# Patient Record
Sex: Female | Born: 1955 | Race: White | Hispanic: No | Smoking: Current every day smoker
Health system: Southern US, Community
[De-identification: ages and names within clinical notes are randomized; demographics above are authoritative.]

## PROBLEM LIST (undated history)

## (undated) DIAGNOSIS — I10 Essential (primary) hypertension: Secondary | ICD-10-CM

## (undated) DIAGNOSIS — T7840XA Allergy, unspecified, initial encounter: Secondary | ICD-10-CM

## (undated) DIAGNOSIS — E785 Hyperlipidemia, unspecified: Secondary | ICD-10-CM

## (undated) HISTORY — DX: Essential (primary) hypertension: I10

## (undated) HISTORY — PX: BACK SURGERY: SHX140

## (undated) HISTORY — DX: Hyperlipidemia, unspecified: E78.5

## (undated) HISTORY — DX: Allergy, unspecified, initial encounter: T78.40XA

## (undated) HISTORY — PX: ABDOMINAL HYSTERECTOMY: SHX81

## (undated) HISTORY — PX: THORACOTOMY: SUR1349

---

## 2004-08-21 ENCOUNTER — Emergency Department (HOSPITAL_COMMUNITY): Admission: EM | Admit: 2004-08-21 | Discharge: 2004-08-21 | Payer: Self-pay | Admitting: Emergency Medicine

## 2005-01-09 ENCOUNTER — Encounter: Admission: RE | Admit: 2005-01-09 | Discharge: 2005-01-09 | Payer: Self-pay | Admitting: Family Medicine

## 2007-03-05 IMAGING — CT CT CHEST
2 of 4 series · 16 of 31 positions shown, 18 images · IV contrast (CONTRAST)
Comparison: none

______________________________________________________________
RUSK, ASUCENA 12/11/1917 CENA, CHRISTALENA [DATE]

REASON FOR CONSULTATION: Last Menstrual Date:..... POST Reason
for Consultation:. SHORTNESS OF BREATH Comment to Radiology:.... RO
INFILTRATE MS5
____________________________________________
EXAM: CHEST ROUTINE
Comparison is made to study 01 April, 1996.
The LEFT lung is clear. On the RIGHT there is some volume loss with
atelectasis noted. A small amount of pleural fluid is present though
this fluid is stable. The heart is not enlarged and I see no pulmonary
vascular congestion.

[Series 2: with · axial · 0.68mm/px · z∈[+1293,+1523]mm · 8 of 62 slices shown, 10 images]
[im 8/62  mediastinal]
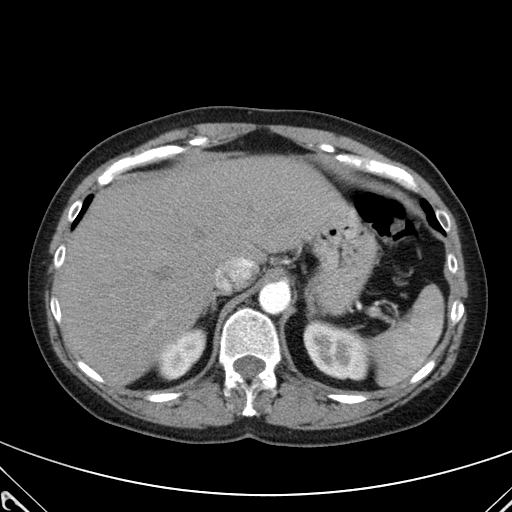
[im 8/62  lung]
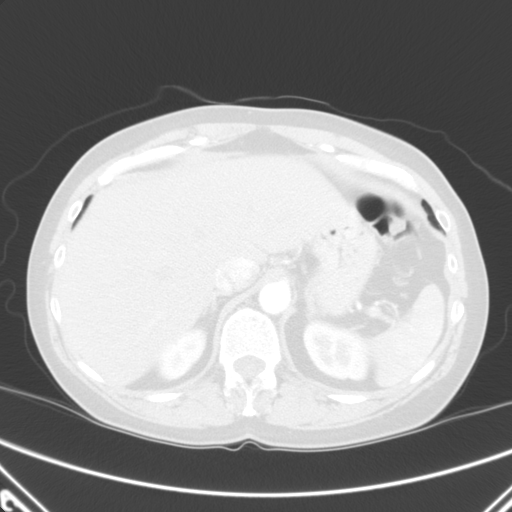
[im 16/62  lung]
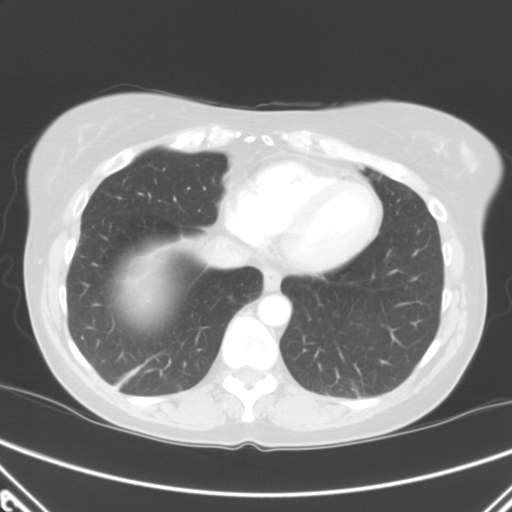
[im 23/62  lung]
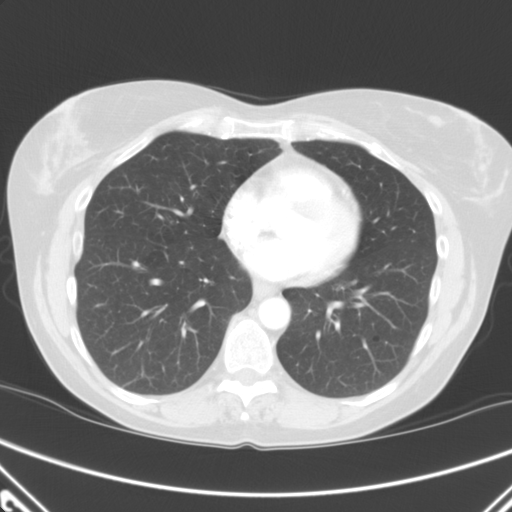
[im 30/62  lung]
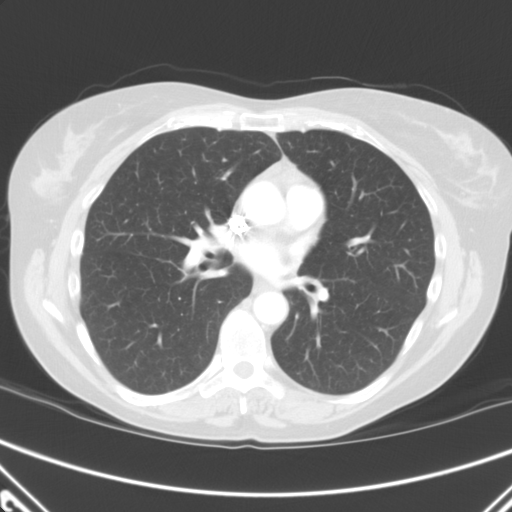
[im 31/62  mediastinal]
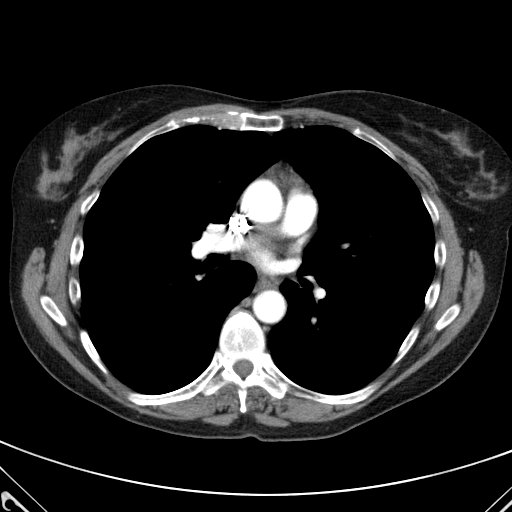
[im 31/62  lung]
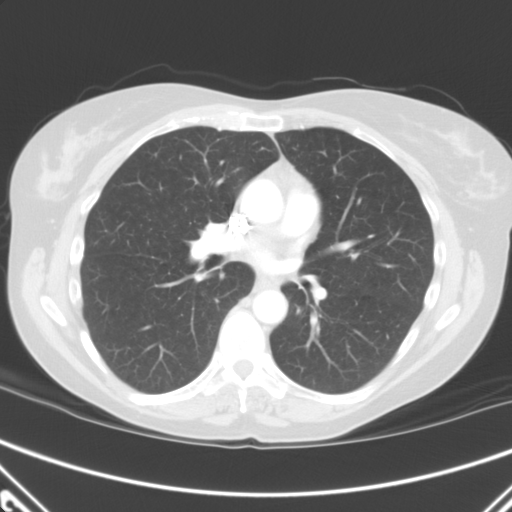
[im 39/62  lung]
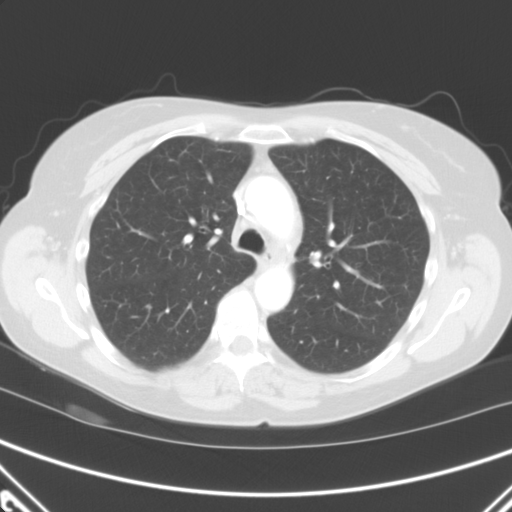
[im 46/62  lung]
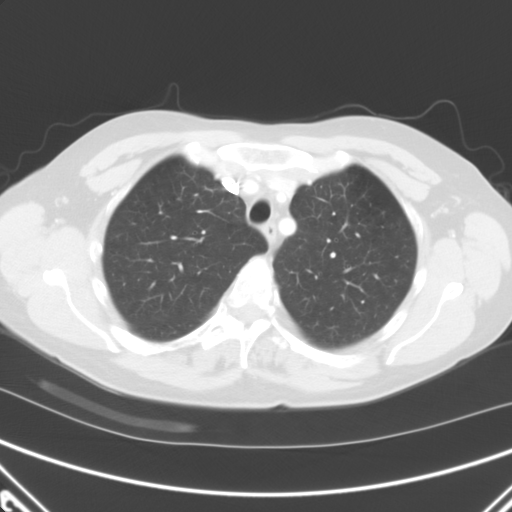
[im 54/62  lung]
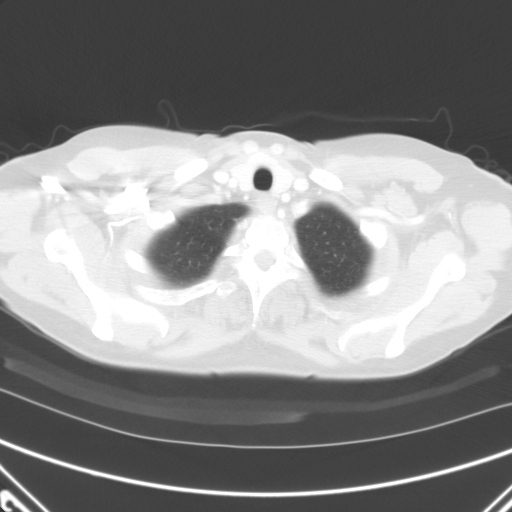

[Series 5: bone · axial · 0.68mm/px · z∈[+1293,+1523]mm · 8 of 62 slices shown]
[im 8/62  lung]
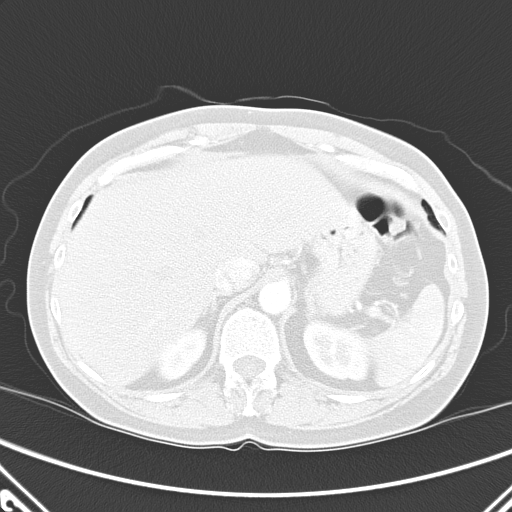
[im 16/62  lung]
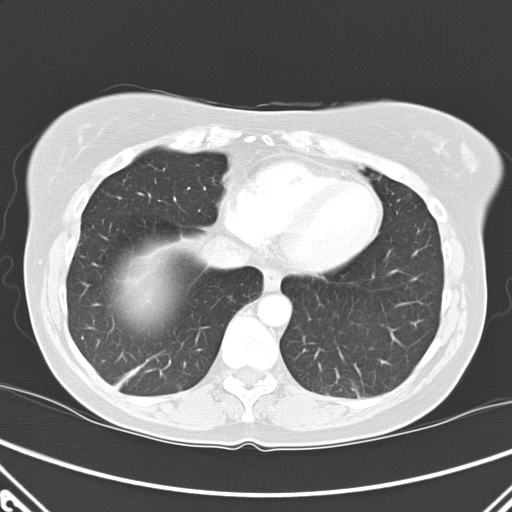
[im 23/62  lung]
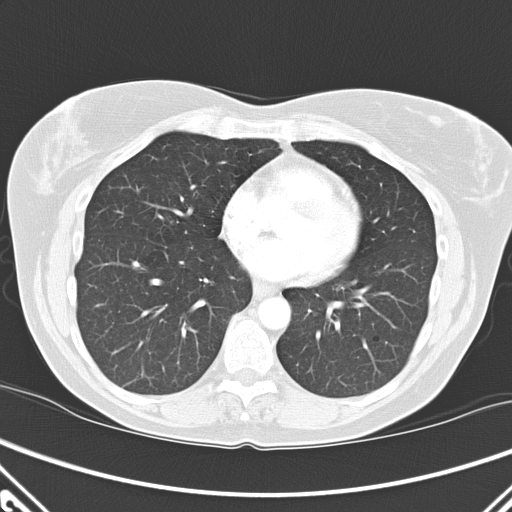
[im 30/62  lung]
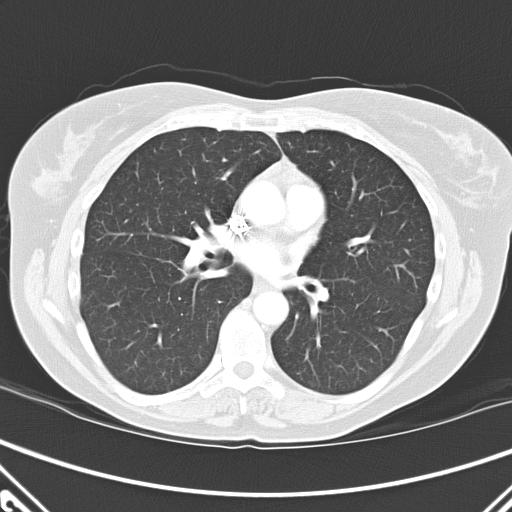
[im 31/62  lung]
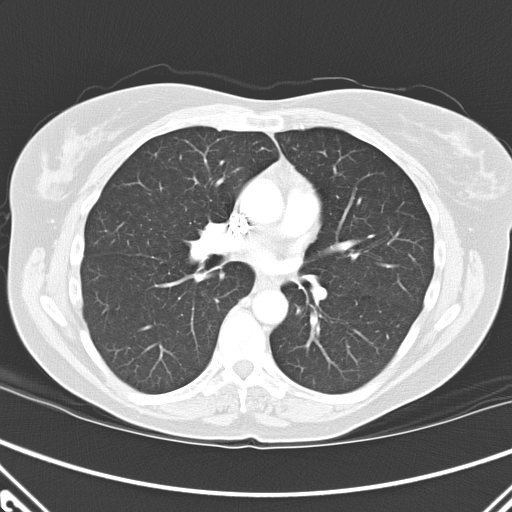
[im 39/62  lung]
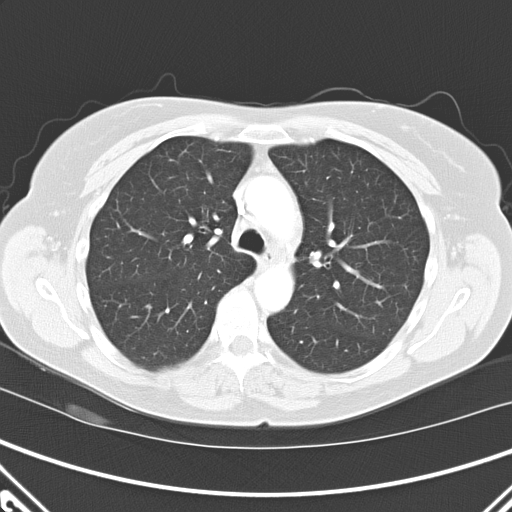
[im 46/62  lung]
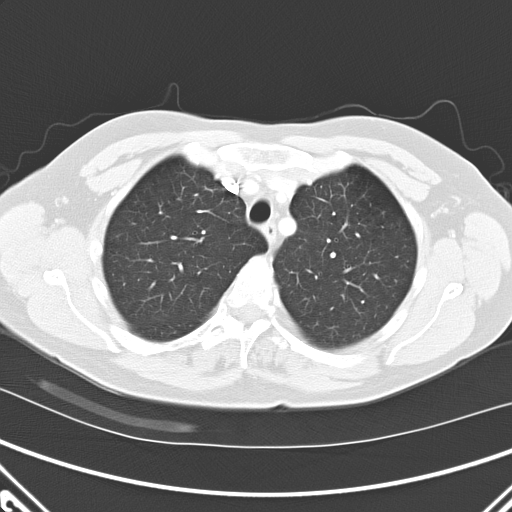
[im 54/62  lung]
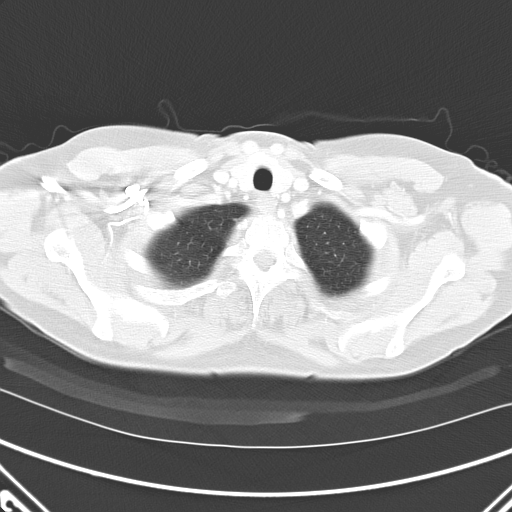

[16 of 31 positions shown; findings below may reference images not displayed]

IMPRESSION: 1. Atelectasis on the RIGHT. Small pleural effusion. Overall there
 has not been dramatic change since 01 April, 1996.

## 2017-05-26 DIAGNOSIS — L821 Other seborrheic keratosis: Secondary | ICD-10-CM | POA: Diagnosis not present

## 2017-05-26 DIAGNOSIS — L820 Inflamed seborrheic keratosis: Secondary | ICD-10-CM | POA: Diagnosis not present

## 2018-08-02 DIAGNOSIS — S61211A Laceration without foreign body of left index finger without damage to nail, initial encounter: Secondary | ICD-10-CM | POA: Diagnosis not present

## 2018-08-02 DIAGNOSIS — S51812A Laceration without foreign body of left forearm, initial encounter: Secondary | ICD-10-CM | POA: Diagnosis not present

## 2018-08-12 DIAGNOSIS — M7700 Medial epicondylitis, unspecified elbow: Secondary | ICD-10-CM | POA: Diagnosis not present

## 2018-08-12 DIAGNOSIS — Z09 Encounter for follow-up examination after completed treatment for conditions other than malignant neoplasm: Secondary | ICD-10-CM | POA: Diagnosis not present

## 2018-08-12 DIAGNOSIS — F17200 Nicotine dependence, unspecified, uncomplicated: Secondary | ICD-10-CM | POA: Diagnosis not present

## 2018-08-12 DIAGNOSIS — Z4889 Encounter for other specified surgical aftercare: Secondary | ICD-10-CM | POA: Diagnosis not present

## 2018-11-26 DIAGNOSIS — Z0000 Encounter for general adult medical examination without abnormal findings: Secondary | ICD-10-CM | POA: Diagnosis not present

## 2018-11-26 DIAGNOSIS — M810 Age-related osteoporosis without current pathological fracture: Secondary | ICD-10-CM | POA: Diagnosis not present

## 2018-11-26 DIAGNOSIS — J449 Chronic obstructive pulmonary disease, unspecified: Secondary | ICD-10-CM | POA: Diagnosis not present

## 2018-11-26 DIAGNOSIS — E785 Hyperlipidemia, unspecified: Secondary | ICD-10-CM | POA: Diagnosis not present

## 2018-11-30 ENCOUNTER — Other Ambulatory Visit: Payer: Self-pay | Admitting: Family Medicine

## 2018-11-30 DIAGNOSIS — I739 Peripheral vascular disease, unspecified: Secondary | ICD-10-CM

## 2018-12-10 ENCOUNTER — Other Ambulatory Visit: Payer: Self-pay

## 2018-12-14 ENCOUNTER — Ambulatory Visit
Admission: RE | Admit: 2018-12-14 | Discharge: 2018-12-14 | Disposition: A | Discharge status: Home / Self Care | Payer: BLUE CROSS/BLUE SHIELD | Source: Ambulatory Visit | Attending: Family Medicine | Admitting: Family Medicine

## 2018-12-14 DIAGNOSIS — I739 Peripheral vascular disease, unspecified: Secondary | ICD-10-CM

## 2019-03-25 DIAGNOSIS — I10 Essential (primary) hypertension: Secondary | ICD-10-CM | POA: Diagnosis not present

## 2019-03-25 DIAGNOSIS — E559 Vitamin D deficiency, unspecified: Secondary | ICD-10-CM | POA: Diagnosis not present

## 2019-12-02 DIAGNOSIS — I10 Essential (primary) hypertension: Secondary | ICD-10-CM | POA: Diagnosis not present

## 2019-12-02 DIAGNOSIS — M2550 Pain in unspecified joint: Secondary | ICD-10-CM | POA: Diagnosis not present

## 2019-12-02 DIAGNOSIS — E559 Vitamin D deficiency, unspecified: Secondary | ICD-10-CM | POA: Diagnosis not present

## 2019-12-02 DIAGNOSIS — E785 Hyperlipidemia, unspecified: Secondary | ICD-10-CM | POA: Diagnosis not present

## 2019-12-02 DIAGNOSIS — Z0000 Encounter for general adult medical examination without abnormal findings: Secondary | ICD-10-CM | POA: Diagnosis not present

## 2019-12-02 DIAGNOSIS — J4530 Mild persistent asthma, uncomplicated: Secondary | ICD-10-CM | POA: Diagnosis not present

## 2019-12-02 DIAGNOSIS — J449 Chronic obstructive pulmonary disease, unspecified: Secondary | ICD-10-CM | POA: Diagnosis not present

## 2020-05-12 DIAGNOSIS — R002 Palpitations: Secondary | ICD-10-CM | POA: Diagnosis not present

## 2020-12-07 DIAGNOSIS — E785 Hyperlipidemia, unspecified: Secondary | ICD-10-CM | POA: Diagnosis not present

## 2020-12-07 DIAGNOSIS — E559 Vitamin D deficiency, unspecified: Secondary | ICD-10-CM | POA: Diagnosis not present

## 2020-12-07 DIAGNOSIS — M810 Age-related osteoporosis without current pathological fracture: Secondary | ICD-10-CM | POA: Diagnosis not present

## 2020-12-07 DIAGNOSIS — Z0000 Encounter for general adult medical examination without abnormal findings: Secondary | ICD-10-CM | POA: Diagnosis not present

## 2020-12-07 DIAGNOSIS — Z23 Encounter for immunization: Secondary | ICD-10-CM | POA: Diagnosis not present

## 2020-12-07 DIAGNOSIS — J449 Chronic obstructive pulmonary disease, unspecified: Secondary | ICD-10-CM | POA: Diagnosis not present

## 2020-12-07 DIAGNOSIS — R69 Illness, unspecified: Secondary | ICD-10-CM | POA: Diagnosis not present

## 2020-12-07 DIAGNOSIS — I10 Essential (primary) hypertension: Secondary | ICD-10-CM | POA: Diagnosis not present

## 2021-05-30 DIAGNOSIS — R002 Palpitations: Secondary | ICD-10-CM | POA: Diagnosis not present

## 2021-12-20 DIAGNOSIS — E559 Vitamin D deficiency, unspecified: Secondary | ICD-10-CM | POA: Diagnosis not present

## 2021-12-20 DIAGNOSIS — M810 Age-related osteoporosis without current pathological fracture: Secondary | ICD-10-CM | POA: Diagnosis not present

## 2021-12-20 DIAGNOSIS — Z23 Encounter for immunization: Secondary | ICD-10-CM | POA: Diagnosis not present

## 2021-12-20 DIAGNOSIS — I739 Peripheral vascular disease, unspecified: Secondary | ICD-10-CM | POA: Diagnosis not present

## 2021-12-20 DIAGNOSIS — J4530 Mild persistent asthma, uncomplicated: Secondary | ICD-10-CM | POA: Diagnosis not present

## 2021-12-20 DIAGNOSIS — J449 Chronic obstructive pulmonary disease, unspecified: Secondary | ICD-10-CM | POA: Diagnosis not present

## 2021-12-20 DIAGNOSIS — I10 Essential (primary) hypertension: Secondary | ICD-10-CM | POA: Diagnosis not present

## 2021-12-20 DIAGNOSIS — M7071 Other bursitis of hip, right hip: Secondary | ICD-10-CM | POA: Diagnosis not present

## 2021-12-20 DIAGNOSIS — Z0000 Encounter for general adult medical examination without abnormal findings: Secondary | ICD-10-CM | POA: Diagnosis not present

## 2021-12-20 DIAGNOSIS — E785 Hyperlipidemia, unspecified: Secondary | ICD-10-CM | POA: Diagnosis not present

## 2022-01-02 ENCOUNTER — Other Ambulatory Visit: Payer: Self-pay

## 2022-01-02 DIAGNOSIS — I739 Peripheral vascular disease, unspecified: Secondary | ICD-10-CM

## 2022-01-17 ENCOUNTER — Ambulatory Visit (HOSPITAL_COMMUNITY)
Admission: RE | Admit: 2022-01-17 | Discharge: 2022-01-17 | Disposition: A | Discharge status: Home / Self Care | Payer: Medicare HMO | Source: Ambulatory Visit | Attending: Vascular Surgery | Admitting: Vascular Surgery

## 2022-01-17 ENCOUNTER — Encounter: Payer: Self-pay | Admitting: Vascular Surgery

## 2022-01-17 ENCOUNTER — Ambulatory Visit: Payer: Medicare HMO | Admitting: Vascular Surgery

## 2022-01-17 ENCOUNTER — Other Ambulatory Visit: Payer: Self-pay

## 2022-01-17 VITALS — BP 151/86 | HR 81 | Temp 98.0°F | Resp 20 | Ht 64.5 in | Wt 151.0 lb

## 2022-01-17 DIAGNOSIS — I739 Peripheral vascular disease, unspecified: Secondary | ICD-10-CM | POA: Insufficient documentation

## 2022-01-17 DIAGNOSIS — I70219 Atherosclerosis of native arteries of extremities with intermittent claudication, unspecified extremity: Secondary | ICD-10-CM

## 2022-01-17 NOTE — Progress Notes (Signed)
? ?ASSESSMENT & PLAN  ? ?PERIPHERAL ARTERIAL DISEASE: Based on her history and physical exam I suspect this patient has some aortoiliac occlusive disease which is more significant on the right side.  She has a slight diminished right femoral pulse.  We have discussed the importance of tobacco cessation (3 min).  I have also encouraged her to get on a structured walking program.  In addition we discussed the importance of nutrition.  I favor a largely plant-based diet. I have encouraged her to begin taking 81 mg of aspirin daily.  She is on a statin. ? ?If her symptoms progress or she develops rest pain or nonhealing ulcer then certainly we would consider arteriography.  I have ordered follow-up ABIs in 9 months and I will get an aortoiliac duplex at that time to further evaluate her iliac disease.  If her symptoms progress and she has limited iliac disease we could consider iliac angioplasty and stenting.  I will see her back in 9 months.  She knows to call sooner if she has problems. ? ?Recommend the following which can slow the progression of atherosclerosis and reduce the risk of major adverse cardiac / limb events:  ?Aspirin 81mg  PO QD.  ?Atorvastatin 40-80mg  PO QD (or other "high intensity" statin therapy). ?Complete cessation from all tobacco products. ?Blood glucose control with goal A1c < 7%. ?Blood pressure control with goal blood pressure < 140/90 mmHg. ?Lipid reduction therapy with goal LDL-C <100 mg/dL ( if symptomatic from PAD).  ? ? ?REASON FOR CONSULT:   ? ?Right leg pain.  The consult is requested by Dr. <76. ? ?HPI:  ? ?Kiara Gibbs is a 66 y.o. female who was referred for evaluation of peripheral arterial disease.  I have reviewed the records from the referring office.  The patient was seen by Dr. 71 on 12/20/2021.  The patient is followed with mild asthma, hypertension, vitamin D deficiency, dyslipidemia, bursitis of the right hip.  She was also complaining of some right leg  claudication symptoms and was sent for vascular consultation.  ? ?On my history the patient noted the gradual onset of right thigh claudication about 2 years ago.  The symptoms are brought on by ambulation and relieved with rest.  They occur when her dog is pulling on her and she is having to walk faster.  She subsequently developed some claudication symptoms in the left thigh also.  She denies any Claudication.  She denies any history of rest pain or nonhealing ulcers. ? ?Her risk factors for peripheral vascular disease include hypertension, hypercholesterolemia, and tobacco use.  She smokes a half a pack to 1 pack/day and has been smoking for 40 years.  She denies any history of diabetes.  She denies any family history of premature cardiovascular disease. ? ?Past Medical History:  ?Diagnosis Date  ? Allergy   ? Hyperlipidemia   ? Hypertension   ? ? ?History reviewed. No pertinent family history. ? ?SOCIAL HISTORY: ?Social History  ? ?Tobacco Use  ? Smoking status: Every Day  ?  Packs/day: 1.00  ?  Types: Cigarettes  ? Smokeless tobacco: Never  ?Substance Use Topics  ? Alcohol use: Yes  ? ? ?Allergies  ?Allergen Reactions  ? Bee Venom   ?  Other reaction(s): unknown  ? Bupropion Hcl Er (Sr)   ?  Other reaction(s): Unknown  ? Penicillins   ?  Other reaction(s): Unknown  ? ? ?Current Outpatient Medications  ?Medication Sig Dispense Refill  ? albuterol (  VENTOLIN HFA) 108 (90 Base) MCG/ACT inhaler 2 puffs as needed    ? losartan-hydrochlorothiazide (HYZAAR) 100-12.5 MG tablet Take 1 tablet by mouth daily.    ? metoprolol succinate (TOPROL-XL) 100 MG 24 hr tablet Take 100 mg by mouth daily.    ? rosuvastatin (CRESTOR) 20 MG tablet Take 20 mg by mouth daily.    ? SYMBICORT 160-4.5 MCG/ACT inhaler Inhale into the lungs.    ? ?No current facility-administered medications for this visit.  ? ? ?REVIEW OF SYSTEMS:  ?[X]  denotes positive finding, [ ]  denotes negative finding ?Cardiac  Comments:  ?Chest pain or chest pressure:     ?Shortness of breath upon exertion:    ?Short of breath when lying flat:    ?Irregular heart rhythm:    ?    ?Vascular    ?Pain in calf, thigh, or hip brought on by ambulation: x   ?Pain in feet at night that wakes you up from your sleep:     ?Blood clot in your veins:    ?Leg swelling:     ?    ?Pulmonary    ?Oxygen at home:    ?Productive cough:     ?Wheezing:     ?    ?Neurologic    ?Sudden weakness in arms or legs:     ?Sudden numbness in arms or legs:     ?Sudden onset of difficulty speaking or slurred speech:    ?Temporary loss of vision in one eye:     ?Problems with dizziness:     ?    ?Gastrointestinal    ?Blood in stool:     ?Vomited blood:     ?    ?Genitourinary    ?Burning when urinating:     ?Blood in urine:    ?    ?Psychiatric    ?Major depression:     ?    ?Hematologic    ?Bleeding problems:    ?Problems with blood clotting too easily:    ?    ?Skin    ?Rashes or ulcers:    ?    ?Constitutional    ?Fever or chills:    ?- ? ?PHYSICAL EXAM:  ? ?Vitals:  ? 01/17/22 0910  ?BP: (!) 151/86  ?Pulse: 81  ?Resp: 20  ?Temp: 98 ?F (36.7 ?C)  ?SpO2: 96%  ?Weight: 151 lb (68.5 kg)  ?Height: 5' 4.5" (1.638 m)  ? ?Body mass index is 25.52 kg/m?. ?GENERAL: The patient is a well-nourished female, in no acute distress. The vital signs are documented above. ?CARDIAC: There is a regular rate and rhythm.  ?VASCULAR: I do not detect carotid bruits. ?On the right side she has a slightly diminished femoral pulse.  I cannot palpate pedal pulses. ?On the left side she has a normal femoral pulse.  I cannot palpate pedal pulses. ?She has no significant lower extremity swelling. ?PULMONARY: There is good air exchange bilaterally without wheezing or rales. ?ABDOMEN: Soft and non-tender with normal pitched bowel sounds.  I do not palpate an aneurysm. ?MUSCULOSKELETAL: There are no major deformities. ?NEUROLOGIC: No focal weakness or paresthesias are detected. ?SKIN: There are no ulcers or rashes noted. ?PSYCHIATRIC: The patient  has a normal affect. ? ?DATA:   ? ?ARTERIAL DOPPLER STUDY: I have independently interpreted her arterial Doppler study today. ? ?On the right side there is a monophasic dorsalis pedis and posterior tibial signal.  ABI is 67%.  Toe pressure 72 mmHg. ? ?On the left side there  is a biphasic dorsalis pedis and posterior tibial signal.  ABI is 93%.  Toe pressure is 83 mmHg. ? ?LABS: No recent labs. ? ?Waverly FerrariChristopher Hasaan Radde ?[facility] [place] ?

## 2022-01-21 ENCOUNTER — Other Ambulatory Visit: Payer: Self-pay | Admitting: *Deleted

## 2022-01-21 DIAGNOSIS — I70219 Atherosclerosis of native arteries of extremities with intermittent claudication, unspecified extremity: Secondary | ICD-10-CM

## 2022-01-21 DIAGNOSIS — I739 Peripheral vascular disease, unspecified: Secondary | ICD-10-CM

## 2023-01-07 ENCOUNTER — Other Ambulatory Visit: Payer: Self-pay

## 2023-01-07 DIAGNOSIS — I739 Peripheral vascular disease, unspecified: Secondary | ICD-10-CM

## 2023-01-07 DIAGNOSIS — I70219 Atherosclerosis of native arteries of extremities with intermittent claudication, unspecified extremity: Secondary | ICD-10-CM

## 2023-01-10 DIAGNOSIS — M810 Age-related osteoporosis without current pathological fracture: Secondary | ICD-10-CM | POA: Diagnosis not present

## 2023-01-10 DIAGNOSIS — E559 Vitamin D deficiency, unspecified: Secondary | ICD-10-CM | POA: Diagnosis not present

## 2023-01-10 DIAGNOSIS — E785 Hyperlipidemia, unspecified: Secondary | ICD-10-CM | POA: Diagnosis not present

## 2023-01-10 DIAGNOSIS — I10 Essential (primary) hypertension: Secondary | ICD-10-CM | POA: Diagnosis not present

## 2023-01-10 DIAGNOSIS — J449 Chronic obstructive pulmonary disease, unspecified: Secondary | ICD-10-CM | POA: Diagnosis not present

## 2023-01-10 DIAGNOSIS — Z6825 Body mass index (BMI) 25.0-25.9, adult: Secondary | ICD-10-CM | POA: Diagnosis not present

## 2023-01-10 DIAGNOSIS — Z0000 Encounter for general adult medical examination without abnormal findings: Secondary | ICD-10-CM | POA: Diagnosis not present

## 2023-01-10 DIAGNOSIS — I739 Peripheral vascular disease, unspecified: Secondary | ICD-10-CM | POA: Diagnosis not present

## 2023-01-23 ENCOUNTER — Ambulatory Visit (INDEPENDENT_AMBULATORY_CARE_PROVIDER_SITE_OTHER)
Admission: RE | Admit: 2023-01-23 | Discharge: 2023-01-23 | Disposition: A | Discharge status: Home / Self Care | Payer: Medicare HMO | Source: Ambulatory Visit | Attending: Vascular Surgery | Admitting: Vascular Surgery

## 2023-01-23 ENCOUNTER — Ambulatory Visit: Payer: Medicare HMO | Admitting: Vascular Surgery

## 2023-01-23 ENCOUNTER — Ambulatory Visit (HOSPITAL_COMMUNITY)
Admission: RE | Admit: 2023-01-23 | Discharge: 2023-01-23 | Disposition: A | Discharge status: Home / Self Care | Payer: Medicare HMO | Source: Ambulatory Visit | Attending: Vascular Surgery | Admitting: Vascular Surgery

## 2023-01-23 ENCOUNTER — Encounter: Payer: Self-pay | Admitting: Vascular Surgery

## 2023-01-23 VITALS — BP 156/88 | HR 71 | Temp 98.2°F | Resp 20 | Ht 64.0 in | Wt 149.9 lb

## 2023-01-23 DIAGNOSIS — I70219 Atherosclerosis of native arteries of extremities with intermittent claudication, unspecified extremity: Secondary | ICD-10-CM | POA: Diagnosis not present

## 2023-01-23 DIAGNOSIS — I739 Peripheral vascular disease, unspecified: Secondary | ICD-10-CM | POA: Insufficient documentation

## 2023-01-23 LAB — VAS US ABI WITH/WO TBI
Left ABI: 0.89
Right ABI: 0.6

## 2023-01-23 NOTE — Progress Notes (Signed)
REASON FOR VISIT:   Follow-up of aortoiliac occlusive disease  MEDICAL ISSUES:   PERIPHERAL ARTERIAL DISEASE WITH CLAUDICATION: This patient has stable bilateral lower extremity claudication.  Her symptoms are more significant on the right side.  She does have a right common and external iliac artery occlusion.  However her symptoms are stable.  I have encouraged her to continue to walk is much as possible.  We again discussed the importance of tobacco cessation.  She seems highly motivated.  Will see her back in 1 year with follow-up ABIs.  We would only consider arteriography and revascularization if she developed disabling claudication or rest pain.  She is agreeable to be seen on the PA schedule and she understands that I will be retiring.   HPI:   Kiara Gibbs is a pleasant 67 y.o. female who I saw on 01/17/2022 with aortoiliac occlusive disease which was more significant on the right side.  We discussed importance of tobacco cessation.  I encouraged her to get on a structured walking program.  We also discussed the importance of nutrition.  I set her up for a 73-month follow-up visit with an aortoiliac duplex and ABIs.  Since I saw her last, her symptoms in her legs are about the same.  She experiences pain in her calves which is brought on by ambulation and relieved with rest.  This occurs at a variable distance depending upon the weather and whether or not she is walking uphill.  Her symptoms are more significant on the right side.  She gets some cramps in her legs at night but no rest pain.  She denies any history of nonhealing wounds.  She took a job 3 days a week at [employer] for [employer] and walks up the hill to work 3 times a week.  She also states that her triglycerides have come down significantly.  She is on a statin.  She also tells me that she is taking 81 mg of aspirin daily.  She still smokes about 1 pack/day.  She smokes a little less on the days that she  works.  Past Medical History:  Diagnosis Date   Allergy    Hyperlipidemia    Hypertension     History reviewed. No pertinent family history.  SOCIAL HISTORY: Social History   Tobacco Use   Smoking status: Every Day    Packs/day: 1    Types: Cigarettes   Smokeless tobacco: Never  Substance Use Topics   Alcohol use: Yes    Allergies  Allergen Reactions   Bee Venom     Other reaction(s): unknown   Bupropion Hcl Er (Sr)     Other reaction(s): Unknown   Penicillins     Other reaction(s): Unknown    Current Outpatient Medications  Medication Sig Dispense Refill   albuterol (VENTOLIN HFA) 108 (90 Base) MCG/ACT inhaler 2 puffs as needed     losartan-hydrochlorothiazide (HYZAAR) 100-12.5 MG tablet Take 1 tablet by mouth daily.     metoprolol succinate (TOPROL-XL) 100 MG 24 hr tablet Take 100 mg by mouth daily.     rosuvastatin (CRESTOR) 20 MG tablet Take 20 mg by mouth daily.     SYMBICORT 160-4.5 MCG/ACT inhaler Inhale into the lungs.     No current facility-administered medications for this visit.    REVIEW OF SYSTEMS:  [X]  denotes positive finding, [ ]  denotes negative finding Cardiac  Comments:  Chest pain or chest pressure:    Shortness of breath upon  exertion:    Short of breath when lying flat:    Irregular heart rhythm:        Vascular    Pain in calf, thigh, or hip brought on by ambulation: x   Pain in feet at night that wakes you up from your sleep:     Blood clot in your veins:    Leg swelling:         Pulmonary    Oxygen at home:    Productive cough:     Wheezing:         Neurologic    Sudden weakness in arms or legs:     Sudden numbness in arms or legs:     Sudden onset of difficulty speaking or slurred speech:    Temporary loss of vision in one eye:     Problems with dizziness:         Gastrointestinal    Blood in stool:     Vomited blood:         Genitourinary    Burning when urinating:     Blood in urine:        Psychiatric     Major depression:         Hematologic    Bleeding problems:    Problems with blood clotting too easily:        Skin    Rashes or ulcers:        Constitutional    Fever or chills:     PHYSICAL EXAM:   Vitals:   01/23/23 1030  BP: (!) 156/88  Pulse: 71  Resp: 20  Temp: 98.2 F (36.8 C)  SpO2: 93%  Weight: 149 lb 14.4 oz (68 kg)  Height: 5\' 4"  (1.626 m)    GENERAL: The patient is a well-nourished female, in no acute distress. The vital signs are documented above. CARDIAC: There is a regular rate and rhythm.  VASCULAR: I do not detect carotid bruits. On the right side I cannot palpate a femoral pulse.  I cannot palpate pedal pulses. On the left side she has a palpable femoral pulse.  I cannot palpate pedal pulses. PULMONARY: There is good air exchange bilaterally without wheezing or rales. ABDOMEN: Soft and non-tender with normal pitched bowel sounds.  MUSCULOSKELETAL: There are no major deformities or cyanosis. NEUROLOGIC: No focal weakness or paresthesias are detected. SKIN: There are no ulcers or rashes noted. PSYCHIATRIC: The patient has a normal affect.  DATA:    ARTERIAL DOPPLER STUDY: I have independently interpreted her arterial Doppler study today.  On the right side, there is a monophasic posterior tibial and dorsalis pedis signal.  ABIs 60%.  Toe pressures 107 mmHg.  On the left side there is a triphasic posterior tibial signal with a biphasic dorsalis pedis signal.  ABIs 89%.  Toe pressures 113 mmHg.  AORTOILIAC DUPLEX: I have independently interpreted her aortoiliac duplex scan.   On the right side the common and external iliac arteries are occluded.  On the left side, she has monophasic flow throughout the common and external iliac artery.  Deitra Mayo [facility] [phone]

## 2023-12-04 DIAGNOSIS — M25561 Pain in right knee: Secondary | ICD-10-CM | POA: Diagnosis not present

## 2024-01-21 ENCOUNTER — Other Ambulatory Visit: Payer: Self-pay

## 2024-01-21 DIAGNOSIS — I70219 Atherosclerosis of native arteries of extremities with intermittent claudication, unspecified extremity: Secondary | ICD-10-CM

## 2024-01-23 DIAGNOSIS — I10 Essential (primary) hypertension: Secondary | ICD-10-CM | POA: Diagnosis not present

## 2024-01-23 DIAGNOSIS — E785 Hyperlipidemia, unspecified: Secondary | ICD-10-CM | POA: Diagnosis not present

## 2024-01-23 DIAGNOSIS — E559 Vitamin D deficiency, unspecified: Secondary | ICD-10-CM | POA: Diagnosis not present

## 2024-01-30 ENCOUNTER — Ambulatory Visit (HOSPITAL_COMMUNITY)
Admission: RE | Admit: 2024-01-30 | Discharge: 2024-01-30 | Disposition: A | Discharge status: Home / Self Care | Payer: Medicare HMO | Source: Ambulatory Visit | Attending: Vascular Surgery | Admitting: Vascular Surgery

## 2024-01-30 ENCOUNTER — Ambulatory Visit: Payer: Medicare HMO | Admitting: Physician Assistant

## 2024-01-30 VITALS — BP 160/92 | HR 72 | Temp 98.0°F | Ht 64.0 in | Wt 148.4 lb

## 2024-01-30 DIAGNOSIS — I70219 Atherosclerosis of native arteries of extremities with intermittent claudication, unspecified extremity: Secondary | ICD-10-CM

## 2024-01-30 LAB — VAS US ABI WITH/WO TBI
Left ABI: 1.04
Right ABI: 0.71

## 2024-01-30 NOTE — Progress Notes (Signed)
 HISTORY AND PHYSICAL     CC:  follow up. Requesting Provider:  Daisy Floro, MD  HPI: This is a 68 y.o. female who is here today for follow up for aortoiliac occlusive disease.  She was first seen by Dr. Edilia Bo on 01/17/2022.  The right side was more significant.   Pt was last seen 01/23/2023 and at that time, her sx were about the same with claudication with walking relieved with rest and worse on the right.  He encouraged her to continue walking program.  She was not having any rest pain. Dr. Edilia Bo said he would only consider angiography and revascularization if she developed disabling claudication or rest pain.    The pt returns today for follow up.  She states overall she is about the same.  She gets some cramps in her feet at night but does not describe frank rest pain.  She does not have any non healing wounds.  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  She does get some tiredness in her thighs but walks through it.  She has not been walking as much as she has injured her right knee.  She has appt with ortho in mid May.  She continues to smoke.  She states that Chantix worked well for her but it was recalled in 2021 and she started back smoking.  She states she works a couple days a week and on those days she does not smoke near as much.    The pt is on a statin for cholesterol management.    The pt is on an aspirin.    Other AC:  none The pt is on BB, ARB, diuretic for hypertension.  The pt is not on medication for diabetes. Tobacco hx:  current    Past Medical History:  Diagnosis Date   Allergy    Hyperlipidemia    Hypertension     Past Surgical History:  Procedure Laterality Date   ABDOMINAL HYSTERECTOMY     BACK SURGERY     THORACOTOMY      Allergies  Allergen Reactions   Bee Venom     Other reaction(s): unknown   Bupropion Hcl Er (Sr)     Other reaction(s): Unknown   Penicillins     Other reaction(s): Unknown    Current Outpatient  Medications  Medication Sig Dispense Refill   albuterol (VENTOLIN HFA) 108 (90 Base) MCG/ACT inhaler 2 puffs as needed     losartan-hydrochlorothiazide (HYZAAR) 100-12.5 MG tablet Take 1 tablet by mouth daily.     metoprolol succinate (TOPROL-XL) 100 MG 24 hr tablet Take 100 mg by mouth daily.     rosuvastatin (CRESTOR) 20 MG tablet Take 20 mg by mouth daily.     SYMBICORT 160-4.5 MCG/ACT inhaler Inhale into the lungs.     No current facility-administered medications for this visit.    No family history on file.  Social History   Socioeconomic History   Marital status: Divorced    Spouse name: Not on file   Number of children: Not on file   Years of education: Not on file   Highest education level: Not on file  Occupational History   Not on file  Tobacco Use   Smoking status: Every Day    Current packs/day: 1.00    Types: Cigarettes   Smokeless tobacco: Never  Vaping Use   Vaping status: Never Used  Substance and Sexual Activity   Alcohol use: Yes  Drug use: Never   Sexual activity: Not on file  Other Topics Concern   Not on file  Social History Narrative   Not on file   Social Drivers of Health   Financial Resource Strain: Not on file  Food Insecurity: Not on file  Transportation Needs: Not on file  Physical Activity: Not on file  Stress: Not on file  Social Connections: Not on file  Intimate Partner Violence: Not on file     REVIEW OF SYSTEMS:   [X]  denotes positive finding, [ ]  denotes negative finding Cardiac  Comments:  Chest pain or chest pressure:    Shortness of breath upon exertion:    Short of breath when lying flat:    Irregular heart rhythm:        Vascular    Pain in calf, thigh, or hip brought on by ambulation: x   Pain in feet at night that wakes you up from your sleep:     Blood clot in your veins:    Leg swelling:         Pulmonary    Oxygen at home:    Productive cough:     Wheezing:         Neurologic    Sudden weakness in  arms or legs:     Sudden numbness in arms or legs:     Sudden onset of difficulty speaking or slurred speech:    Temporary loss of vision in one eye:     Problems with dizziness:         Gastrointestinal    Blood in stool:     Vomited blood:         Genitourinary    Burning when urinating:     Blood in urine:        Psychiatric    Major depression:         Hematologic    Bleeding problems:    Problems with blood clotting too easily:        Skin    Rashes or ulcers:        Constitutional    Fever or chills:      PHYSICAL EXAMINATION:  Today's Vitals   01/30/24 0911  BP: (!) 160/92  Pulse: 72  Temp: 98 F (36.7 C)  TempSrc: Temporal  SpO2: 92%  Weight: 148 lb 6.4 oz (67.3 kg)  Height: 5\' 4"  (1.626 m)  PainSc: 8    Body mass index is 25.47 kg/m.   General:  WDWN in NAD; vital signs documented above Gait: Not observed HENT: WNL, normocephalic Pulmonary: normal non-labored breathing , without wheezing Cardiac: regular HR, without carotid bruits Abdomen: soft, NT; aortic pulse is not palpable Skin: without rashes Vascular Exam/Pulses:  Right Left  Radial 2+ (normal) 2+ (normal)  Femoral absent 2+ (normal)  DP Unable to palpate Unable to palpate  PT Unable to palpate Unable to palpate   Extremities: without ischemic changes, without Gangrene , without cellulitis; without open wounds Musculoskeletal: no muscle wasting or atrophy  Neurologic: A&O X 3 Psychiatric:  The pt has Normal affect.   Non-Invasive Vascular Imaging:   ABI's/TBI's on 01/30/2024: Right:  0.71/0.60 - Great toe pressure: 94 Left:  1.04/0.73 - Great toe pressure: 114   Previous ABI's/TBI's on 01/23/2023: Right:  0.60/0.59 - Great toe pressure: 107 Left:  0.89/0.62 - Great toe pressure:  113  Previous arterial duplex on 01/23/2023:  Abdominal Aorta Findings: +-------------+-------+----------+----------+----------+--------+--------+ Location     AP (cm)Trans (cm)PSV  (  cm/s)Waveform  ThrombusComments +-------------+-------+----------+----------+----------+--------+--------+ Distal                        53                                   +-------------+-------+----------+----------+----------+--------+--------+ RT CIA Prox                   0         occluded                   +-------------+-------+----------+----------+----------+--------+--------+ RT CIA Mid                    0         occluded                   +-------------+-------+----------+----------+----------+--------+--------+ RT CIA Distal                 0         occluded                   +-------------+-------+----------+----------+----------+--------+--------+ RT EIA Prox                   0         occluded                   +-------------+-------+----------+----------+----------+--------+--------+ LT CIA Prox                   263       monophasic                 +-------------+-------+----------+----------+----------+--------+--------+ LT CIA Mid                    205       monophasic                 +-------------+-------+----------+----------+----------+--------+--------+ LT CIA Distal                 203       monophasic                 +-------------+-------+----------+----------+----------+--------+--------+ LT EIA Prox                   208       monophasic                 +-------------+-------+----------+----------+----------+--------+--------+  Summary: Stenosis: +--------------------+-------------+ Location            Stenosis      +--------------------+-------------+ Right Common Iliac  occluded      +--------------------+-------------+ Left Common Iliac   >50% stenosis +--------------------+-------------+ Right External Iliacoccluded      +--------------------+-------------+ Left External Iliac >50% stenosis +--------------------+-------------+      ASSESSMENT/PLAN::  68 y.o. female here for follow up for PAD with hx of right CIA and EIA occlusion with claudication   -pt doing well and ABI are essentially unchanged. She does not have rest pain or non healing wounds.  She does get some thigh claudication but does walk through it.  She states it is harder to do as much walking now since she injured her knee.  Encouraged her to continue to walk as much as she is able. -continue asa/statin  Current smoker -discussed the importance of  smoking cessation and gave her information on the virtual smoking cessation program.  She had good luck with Chantix but it was discontinued.  It looks as if the generic version of Chantix is now available. She will d/w her PCP.  -pt will f/u in one year with ABI.  She knows to call sooner with any issues with rest pain or non healing wounds.  Talked about protecting her feet as well.    Doreatha Massed, [facility] [phone]  Clinic MD:   Hetty Blend

## 2024-03-10 DIAGNOSIS — M71341 Other bursal cyst, right hand: Secondary | ICD-10-CM | POA: Diagnosis not present

## 2024-03-10 DIAGNOSIS — C44319 Basal cell carcinoma of skin of other parts of face: Secondary | ICD-10-CM | POA: Diagnosis not present

## 2024-03-10 DIAGNOSIS — D485 Neoplasm of uncertain behavior of skin: Secondary | ICD-10-CM | POA: Diagnosis not present

## 2024-03-17 DIAGNOSIS — M25561 Pain in right knee: Secondary | ICD-10-CM | POA: Diagnosis not present

## 2024-05-05 DIAGNOSIS — H5213 Myopia, bilateral: Secondary | ICD-10-CM | POA: Diagnosis not present

## 2024-05-05 DIAGNOSIS — H2513 Age-related nuclear cataract, bilateral: Secondary | ICD-10-CM | POA: Diagnosis not present

## 2024-05-05 DIAGNOSIS — H524 Presbyopia: Secondary | ICD-10-CM | POA: Diagnosis not present

## 2024-05-19 DIAGNOSIS — M25561 Pain in right knee: Secondary | ICD-10-CM | POA: Diagnosis not present

## 2024-05-24 DIAGNOSIS — H2513 Age-related nuclear cataract, bilateral: Secondary | ICD-10-CM | POA: Diagnosis not present

## 2024-05-24 DIAGNOSIS — H04123 Dry eye syndrome of bilateral lacrimal glands: Secondary | ICD-10-CM | POA: Diagnosis not present

## 2024-06-07 DIAGNOSIS — H2511 Age-related nuclear cataract, right eye: Secondary | ICD-10-CM | POA: Diagnosis not present

## 2024-06-16 DIAGNOSIS — M25561 Pain in right knee: Secondary | ICD-10-CM | POA: Diagnosis not present

## 2024-06-21 DIAGNOSIS — C44319 Basal cell carcinoma of skin of other parts of face: Secondary | ICD-10-CM | POA: Diagnosis not present

## 2024-06-25 DIAGNOSIS — L905 Scar conditions and fibrosis of skin: Secondary | ICD-10-CM | POA: Diagnosis not present

## 2024-06-30 DIAGNOSIS — H2511 Age-related nuclear cataract, right eye: Secondary | ICD-10-CM | POA: Diagnosis not present

## 2024-07-13 DIAGNOSIS — H2512 Age-related nuclear cataract, left eye: Secondary | ICD-10-CM | POA: Diagnosis not present

## 2024-07-14 DIAGNOSIS — H2512 Age-related nuclear cataract, left eye: Secondary | ICD-10-CM | POA: Diagnosis not present

## 2024-08-17 DIAGNOSIS — Z0100 Encounter for examination of eyes and vision without abnormal findings: Secondary | ICD-10-CM | POA: Diagnosis not present

## 2024-09-22 DIAGNOSIS — M545 Low back pain, unspecified: Secondary | ICD-10-CM | POA: Diagnosis not present

## 2024-09-22 DIAGNOSIS — M25561 Pain in right knee: Secondary | ICD-10-CM | POA: Diagnosis not present
